# Patient Record
Sex: Male | Born: 1985 | Race: Black or African American | Hispanic: No | Marital: Single | Smoking: Never smoker
Health system: Southern US, Community
[De-identification: ages and names within clinical notes are randomized; demographics above are authoritative.]

## PROBLEM LIST (undated history)

## (undated) DIAGNOSIS — J301 Allergic rhinitis due to pollen: Secondary | ICD-10-CM

---
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
## 2004-10-10 ENCOUNTER — Emergency Department (HOSPITAL_COMMUNITY): Admission: EM | Admit: 2004-10-10 | Discharge: 2004-10-10 | Payer: Self-pay | Admitting: Emergency Medicine

## 2004-10-20 ENCOUNTER — Emergency Department (HOSPITAL_COMMUNITY): Admission: EM | Admit: 2004-10-20 | Discharge: 2004-10-20 | Payer: Self-pay | Admitting: Emergency Medicine

## 2004-11-18 ENCOUNTER — Emergency Department (HOSPITAL_COMMUNITY): Admission: EM | Admit: 2004-11-18 | Discharge: 2004-11-18 | Payer: Self-pay | Admitting: Emergency Medicine

## 2008-10-06 ENCOUNTER — Emergency Department (HOSPITAL_COMMUNITY): Admission: EM | Admit: 2008-10-06 | Discharge: 2008-10-06 | Payer: Self-pay | Admitting: Emergency Medicine

## 2008-10-17 ENCOUNTER — Emergency Department (HOSPITAL_COMMUNITY): Admission: EM | Admit: 2008-10-17 | Discharge: 2008-10-17 | Payer: Self-pay | Admitting: Family Medicine

## 2009-04-18 ENCOUNTER — Emergency Department (HOSPITAL_COMMUNITY): Admission: EM | Admit: 2009-04-18 | Discharge: 2009-04-18 | Payer: Self-pay | Admitting: Emergency Medicine

## 2011-08-20 ENCOUNTER — Emergency Department (HOSPITAL_COMMUNITY)
Admission: EM | Admit: 2011-08-20 | Discharge: 2011-08-20 | Disposition: A | Discharge status: Home / Self Care | Payer: Self-pay | Attending: Emergency Medicine | Admitting: Emergency Medicine

## 2011-08-20 ENCOUNTER — Encounter (HOSPITAL_COMMUNITY): Payer: Self-pay | Admitting: *Deleted

## 2011-08-20 DIAGNOSIS — R109 Unspecified abdominal pain: Secondary | ICD-10-CM | POA: Insufficient documentation

## 2011-08-20 HISTORY — DX: Allergic rhinitis due to pollen: J30.1

## 2011-08-20 LAB — COMPREHENSIVE METABOLIC PANEL
ALT: 53 U/L (ref 0–53)
AST: 32 U/L (ref 0–37)
Albumin: 3.8 g/dL (ref 3.5–5.2)
Calcium: 8.8 mg/dL (ref 8.4–10.5)
Creatinine, Ser: 0.95 mg/dL (ref 0.50–1.35)
GFR calc non Af Amer: >90 mL/min (ref >90)
Sodium: 137 mEq/L (ref 135–145)
Total Protein: 6.9 g/dL (ref 6.0–8.3)

## 2011-08-20 LAB — CBC
HCT: 41.6 % (ref 39.0–52.0)
MCHC: 34.1 g/dL (ref 30.0–36.0)
MCV: 89.7 fL (ref 78.0–100.0)
Platelets: 257 10*3/uL (ref 150–400)
RDW: 13.7 % (ref 11.5–15.5)
WBC: 9 10*3/uL (ref 4.0–10.5)

## 2011-08-20 LAB — URINALYSIS, ROUTINE W REFLEX MICROSCOPIC
Glucose, UA: NEGATIVE mg/dL
Hgb urine dipstick: NEGATIVE
Ketones, ur: NEGATIVE mg/dL
Leukocytes, UA: NEGATIVE
Protein, ur: NEGATIVE mg/dL
Urobilinogen, UA: 0.2 mg/dL (ref 0.0–1.0)

## 2011-08-20 LAB — DIFFERENTIAL
Basophils Absolute: 0 10*3/uL (ref 0.0–0.1)
Basophils Relative: 0 % (ref 0–1)
Eosinophils Relative: 6 % — ABNORMAL HIGH (ref 0–5)
Monocytes Absolute: 0.5 10*3/uL (ref 0.1–1.0)

## 2011-08-20 MED ORDER — FAMOTIDINE 20 MG PO TABS
20.0000 mg | ORAL_TABLET | Freq: Once | ORAL | Status: AC
  Administered 2011-08-20: 20 mg via ORAL
  Filled 2011-08-20: qty 1

## 2011-08-20 MED ORDER — IBUPROFEN 800 MG PO TABS
800.0000 mg | ORAL_TABLET | Freq: Once | ORAL | Status: AC
  Administered 2011-08-20: 800 mg via ORAL
  Filled 2011-08-20: qty 1

## 2011-08-20 NOTE — ED Notes (Signed)
Pt reports abd pain without n/v/d. Pt reports pain feels like pressure, states that pain is worse at night.

## 2011-08-20 NOTE — Progress Notes (Signed)
Pt listed as self pay with no insurance coverage Pt confirms he is self pay [place] resident CM and [facility] coordinator spoke with him Pt offered [facility] services to assist with finding a [place] self pay provider Pt stated he was not interested in information

## 2011-08-20 NOTE — ED Provider Notes (Signed)
History     CSN: [id number]  Arrival date & time 08/20/11  1459   First MD Initiated Contact with Patient 08/20/11 1719      Chief Complaint  Patient presents with  . Abdominal Pain    (Consider location/radiation/quality/duration/timing/severity/associated sxs/prior treatment) HPI  C/o abdominal pain x 3 days, worsening while he's trying to sleep. Describes as pressure. Constant at night (worse) and during the day. No back pain. Denies N/V/d/constipation. Does not feel like his indigestion. Denies hematuria/dysuria//urgency. Has been using the bathroom more at night.  Sexually active. No penile discharge. No scrotal pain. Denies fever/chills. Denies h/o abdominal surgeries.  Denies CP/SOB  ED Notes, ED Provider Notes from 08/20/11 0000 to 08/20/11 16:15:45       Myna Hidalgo, RN 08/20/2011 16:14      Pt reports abd pain without n/v/d. Pt reports pain feels like pressure, states that pain is worse at night.    Past Medical History  Diagnosis Date  . Hay fever     History reviewed. No pertinent past surgical history.  No family history on file.  History  Substance Use Topics  . Smoking status: Never Smoker   . Smokeless tobacco: Not on file  . Alcohol Use: Yes     occasionally      Review of Systems  All other systems reviewed and are negative.  except as noted HPI  Allergies  Review of patient's allergies indicates no known allergies.  Home Medications   Current Outpatient Rx  Name Route Sig Dispense Refill  . LORATADINE 10 MG PO TABS Oral Take 10 mg by mouth daily.    . SODIUM & POTASSIUM BICARBONATE PO TBEF Oral Take 1 tablet by mouth daily as needed. Acid reflux      BP 122/82  Pulse 76  Temp(Src) 98.3 F (36.8 C) (Oral)  Resp 16  SpO2 100%  Physical Exam  Nursing note and vitals reviewed. Constitutional: He is oriented to person, place, and time. He appears well-developed and well-nourished. No distress.  HENT:  Head: Atraumatic.    Mouth/Throat: Oropharynx is clear and moist.  Eyes: Conjunctivae are normal. Pupils are equal, round, and reactive to light.  Neck: Neck supple.  Cardiovascular: Normal rate, regular rhythm, normal heart sounds and intact distal pulses.  Exam reveals no gallop and no friction rub.   No murmur heard. Pulmonary/Chest: Effort normal. No respiratory distress. He has no wheezes. He has no rales.  Abdominal: Soft. Bowel sounds are normal. There is tenderness. There is no rebound and no guarding.       Min epigastric/superior periumbilical ttp No RLQ ttp  Genitourinary:       External genitalia unremarkable No testicle/scrotal edema/erythema/ttp  Musculoskeletal: Normal range of motion. He exhibits no edema and no tenderness.  Neurological: He is alert and oriented to person, place, and time.  Skin: Skin is warm and dry.  Psychiatric: He has a normal mood and affect.    ED Course  Procedures (including critical care time)  Labs Reviewed  DIFFERENTIAL - Abnormal; Notable for the following:    Eosinophils Relative 6 (*)    All other components within normal limits  URINALYSIS, ROUTINE W REFLEX MICROSCOPIC  CBC  COMPREHENSIVE METABOLIC PANEL  LIPASE, BLOOD  LAB REPORT - SCANNED   No results found.   1. Abdominal  pain, other specified site    MDM  Abdominal pain, unk cause x 3 days. Patient appears well. Abd benign. No r/g. Labs unremarkable including U/A.  I do not feel that the patient requires abdominal imaging at this time. Low suspicion for colitis, cholecystitis, appendicitis. Will discharge home with precautions for return.     PMD- none      Forbes Cellar, MD 08/22/11 [phone]

## 2011-08-20 NOTE — ED Notes (Signed)
MD at bedside. Dr. Webb at bedside.  

## 2011-08-20 NOTE — Discharge Instructions (Signed)
Abdominal Pain Abdominal pain can be caused by many things. Your caregiver decides the seriousness of your pain by an examination and possibly blood tests and X-rays. Many cases can be observed and treated at home. Most abdominal pain is not caused by a disease and will probably improve without treatment. However, in many cases, more time must pass before a clear cause of the pain can be found. Before that point, it may not be known if you need more testing, or if hospitalization or surgery is needed. HOME CARE INSTRUCTIONS   Do not take laxatives unless directed by your caregiver.   Take pain medicine only as directed by your caregiver.   Only take over-the-counter or prescription medicines for pain, discomfort, or fever as directed by your caregiver.   Try a clear liquid diet (broth, tea, or water) for as long as directed by your caregiver. Slowly move to a bland diet as tolerated.  SEEK IMMEDIATE MEDICAL CARE IF:   The pain does not go away.   You have a fever.   You keep throwing up (vomiting).   The pain is felt only in portions of the abdomen. Pain in the right side could possibly be appendicitis. In an adult, pain in the left lower portion of the abdomen could be colitis or diverticulitis.   You pass bloody or black tarry stools.  MAKE SURE YOU:   Understand these instructions.   Will watch your condition.   Will get help right away if you are not doing well or get worse.  Document Released: 02/28/2005 Document Revised: 05/10/2011 Document Reviewed: 01/07/2008 ExitCare Patient Information 2012 [employer]  RESOURCE GUIDE  Dental Problems  Patients with Medicaid: [facility]                     [facility] [address]                                           [address] Phone:  [phone]                                                   Phone:  [phone]  If unable to pay or uninsured, contact:  [facility] or [place]  [facility] to become qualified for the adult dental clinic.  Chronic Pain Problems Contact [facility]  [phone] Patients need to be referred by their primary care doctor.  Insufficient Money for Medicine Contact [employer]:  call "211" or [facility] 271-5999.  No Primary Care Doctor Call [facility]  [phone] Other agencies that provide inexpensive medical care    [facility]  [phone]    [facility]  [phone]    [facility]  [phone]    [facility] Clinic  [phone]    [facility]  [phone]    [facility]  [phone]  Psychological Services [facility]  [phone] [facility]  [phone] [place] [facility]   [phone] (emergency services 641-4993)  Abuse/Neglect [place] Child Abuse Hotline (336) 641-3795 [place] Child Abuse Hotline 800-378-5315 (After Hours)  Emergency Shelter [facility] (336) 271-5985  Maternity Homes   [facility] (336) 275-9566 [facility] (704) 372-4663  MRSA Hotline #:   832-7006    [place] [facility]  [facility] [place]  [employer]                           [place] [facility] [address] [place]                     [address]         [address]  [place]                                               [place]                              [place] Phone:  [phone]                                  Phone:  [phone]                   Phone:  [phone]  [place] [facility] Phone:  [phone]  [place] Child Abuse Hotline (336) 342-1394 (336) 342-3537 (After Hours)  

## 2012-08-12 ENCOUNTER — Encounter (HOSPITAL_COMMUNITY): Payer: Self-pay | Admitting: Emergency Medicine

## 2012-08-12 ENCOUNTER — Emergency Department (HOSPITAL_COMMUNITY): Payer: Self-pay

## 2012-08-12 ENCOUNTER — Emergency Department (HOSPITAL_COMMUNITY)
Admission: EM | Admit: 2012-08-12 | Discharge: 2012-08-12 | Disposition: A | Discharge status: Home / Self Care | Payer: Self-pay | Attending: Emergency Medicine | Admitting: Emergency Medicine

## 2012-08-12 DIAGNOSIS — R0602 Shortness of breath: Secondary | ICD-10-CM | POA: Insufficient documentation

## 2012-08-12 DIAGNOSIS — Z79899 Other long term (current) drug therapy: Secondary | ICD-10-CM | POA: Insufficient documentation

## 2012-08-12 DIAGNOSIS — Z8709 Personal history of other diseases of the respiratory system: Secondary | ICD-10-CM | POA: Insufficient documentation

## 2012-08-12 DIAGNOSIS — R0789 Other chest pain: Secondary | ICD-10-CM | POA: Insufficient documentation

## 2012-08-12 LAB — D-DIMER, QUANTITATIVE: D-Dimer, Quant: 0.3 ug/mL-FEU (ref 0.00–0.48)

## 2012-08-12 MED ORDER — NAPROXEN 500 MG PO TABS: 500.0000 mg | ORAL_TABLET | Freq: Two times a day (BID) | ORAL | Status: DC

## 2012-08-12 MED ORDER — TRAMADOL HCL 50 MG PO TABS: 50.0000 mg | ORAL_TABLET | Freq: Four times a day (QID) | ORAL | Status: DC | PRN

## 2012-08-12 MED ORDER — METHOCARBAMOL 500 MG PO TABS: 1000.0000 mg | ORAL_TABLET | Freq: Four times a day (QID) | ORAL | Status: DC

## 2012-08-12 NOTE — ED Notes (Signed)
Patient transported to X-ray 

## 2012-08-12 NOTE — ED Provider Notes (Signed)
Medical screening examination/treatment/procedure(s) were performed by non-physician practitioner and as supervising physician I was immediately available for consultation/collaboration.   Loren Racer, MD 08/12/12 1536

## 2012-08-12 NOTE — ED Notes (Signed)
States that he picked his son up to go into the daycare and felt a pull in his chest. States that he put the child down and the pain was less intense. States that he has pain in the right chest when he lifts his arms, turns his head or takes a deep breath.

## 2012-08-12 NOTE — ED Provider Notes (Signed)
History     CSN: [id number]  Arrival date & time 08/12/12  1116   First MD Initiated Contact with Patient 08/12/12 1132      Chief Complaint  Patient presents with  . Chest Pain    (Consider location/radiation/quality/duration/timing/severity/associated sxs/prior treatment) HPI Comments: Patient with acute onset of right-sided chest pain approximately one hour prior to arrival that was severe and associated with shortness of breath at onset. Patient was carrying his 66-year-old child who weighs approximately 30 pounds. Pain improved gradually once he put the child down. Pain is made worse with movement. It is not made worse with palpation. It is worse when he takes a deep breath in. Patient states that he drives or flies to [place] frequently for work and he took a 13 hour long car ride last week. He denies pain in his legs or swelling in his legs currently. No history of blood clots. Onset acute. Course is improving.   Patient is a 27 y.o. male presenting with chest pain. The history is provided by the patient.  Chest Pain Associated symptoms: shortness of breath   Associated symptoms: no abdominal pain, no cough, no fever, no headache, no nausea and not vomiting     Past Medical History  Diagnosis Date  . Hay fever     History reviewed. No pertinent past surgical history.  No family history on file.  History  Substance Use Topics  . Smoking status: Never Smoker   . Smokeless tobacco: Not on file  . Alcohol Use: Yes     Comment: occasionally      Review of Systems  Constitutional: Negative for fever.  HENT: Negative for sore throat and rhinorrhea.   Eyes: Negative for redness.  Respiratory: Positive for shortness of breath. Negative for cough.   Cardiovascular: Positive for chest pain. Negative for leg swelling.  Gastrointestinal: Negative for nausea, vomiting, abdominal pain and diarrhea.  Genitourinary: Negative for dysuria.  Musculoskeletal: Negative for  myalgias.  Skin: Negative for rash.  Neurological: Negative for headaches.    Allergies  Review of patient's allergies indicates no known allergies.  Home Medications   Current Outpatient Rx  Name  Route  Sig  Dispense  Refill  . loratadine (CLARITIN) 10 MG tablet   Oral   Take 10 mg by mouth daily.         . sodium-potassium bicarbonate (ALKA-SELTZER GOLD) TBEF   Oral   Take 1 tablet by mouth daily as needed. Acid reflux           BP 137/84  Pulse 106  Temp(Src) 98.5 F (36.9 C) (Oral)  Resp 26  SpO2 100%  Physical Exam  Nursing note and vitals reviewed. Constitutional: He appears well-developed and well-nourished.  HENT:  Head: Normocephalic and atraumatic.  Eyes: Conjunctivae are normal. Right eye exhibits no discharge. Left eye exhibits no discharge.  Neck: Normal range of motion. Neck supple.  Cardiovascular: Regular rhythm and normal heart sounds.  Tachycardia present.   Heart rate 103  Pulmonary/Chest: Effort normal and breath sounds normal. He has no wheezes. He has no rales. He exhibits no tenderness (Pain not readily reproducible to palpation).  Abdominal: Soft. There is no tenderness.  Musculoskeletal: He exhibits no edema and no tenderness.  Patient complains of right chest pain that is worsened with turning head to the right and raising his right arm. No lower extremity edema or tenderness.  Neurological: He is alert.  Skin: Skin is warm and dry.  Psychiatric: He has  a normal mood and affect.    ED Course  Procedures (including critical care time)  Labs Reviewed  D-DIMER, QUANTITATIVE   Dg Chest 2 View  08/12/2012  *RADIOLOGY REPORT*  Clinical Data: Chest pain  CHEST - 2 VIEW  Comparison: None.  Findings: Lungs clear.  Heart size and pulmonary vascularity are normal.  No adenopathy.  No pneumothorax.  No bone lesions.  IMPRESSION: No abnormality noted.   Original Report Authenticated By: Bretta Bang, M.D.      1. Musculoskeletal chest  pain     11:52 AM Patient seen and examined. Work-up initiated. Given risk factors for DVT, will need to rule out PE.   Vital signs reviewed and are as follows: Filed Vitals:   08/12/12 1128  BP: 137/84  Pulse: 106  Temp: 98.5 F (36.9 C)  Resp: 26    Date: 08/12/2012  Rate: 104  Rhythm: sinus tachycardia  QRS Axis: normal  Intervals: normal  ST/T Wave abnormalities: nonspecific T wave changes  Conduction Disutrbances:none  Narrative Interpretation:   Old EKG Reviewed: none available  12:55 PM Patient informed of all results.   Patient was counseled to return with severe chest pain, especially if the pain is crushing or pressure-like and spreads to the arms, back, neck, or jaw, or if they have sweating, nausea, or shortness of breath with the pain. They were encouraged to call 911 with these symptoms.   They were also told to return if their chest pain gets worse and does not go away with rest, they have an attack of chest pain lasting longer than usual despite rest and treatment with the medications their caregiver has prescribed, if they wake from sleep with chest pain or shortness of breath, if they feel dizzy or faint, if they have chest pain not typical of their usual pain, or if they have any other emergent concerns regarding their health.  The patient verbalized understanding and agreed.      MDM  Patient with chest pain worsened with movement and deep breathing. Given DVT risk factors, d-dimer ordered and was normal. CXR neg. EKG abnormal but symptoms are clinically musculoskeletal and reproducible with movement. Do not clinically suspect ACS. Recc PCP f/u and conservative management. Referrals given.         Renne Crigler, PA-C 08/12/12 1259

## 2014-08-30 DIAGNOSIS — R509 Fever, unspecified: Secondary | ICD-10-CM | POA: Insufficient documentation

## 2014-08-30 DIAGNOSIS — R52 Pain, unspecified: Secondary | ICD-10-CM | POA: Insufficient documentation

## 2014-08-30 DIAGNOSIS — M791 Myalgia: Secondary | ICD-10-CM | POA: Insufficient documentation

## 2014-08-30 DIAGNOSIS — Z79899 Other long term (current) drug therapy: Secondary | ICD-10-CM | POA: Insufficient documentation

## 2014-08-30 DIAGNOSIS — Z8709 Personal history of other diseases of the respiratory system: Secondary | ICD-10-CM | POA: Insufficient documentation

## 2014-08-31 ENCOUNTER — Encounter (HOSPITAL_COMMUNITY): Payer: Self-pay | Admitting: Emergency Medicine

## 2014-08-31 ENCOUNTER — Emergency Department (HOSPITAL_COMMUNITY)
Admission: EM | Admit: 2014-08-31 | Discharge: 2014-08-31 | Disposition: A | Discharge status: Home / Self Care | Payer: Self-pay | Attending: Emergency Medicine | Admitting: Emergency Medicine

## 2014-08-31 DIAGNOSIS — R6889 Other general symptoms and signs: Secondary | ICD-10-CM

## 2014-08-31 LAB — RAPID STREP SCREEN (MED CTR MEBANE ONLY): STREPTOCOCCUS, GROUP A SCREEN (DIRECT): NEGATIVE

## 2014-08-31 MED ORDER — ACETAMINOPHEN 325 MG PO TABS
650.0000 mg | ORAL_TABLET | Freq: Once | ORAL | Status: AC
  Administered 2014-08-31: 650 mg via ORAL
  Filled 2014-08-31: qty 2

## 2014-08-31 NOTE — ED Notes (Signed)
Mask placed on patient on return to lobby.

## 2014-08-31 NOTE — ED Notes (Signed)
Patient states at @0400  he began feeling weak, having generalized body aches, and fever. Patient did not check his temperature at home.  Patient denies similar sick contact. Patient taking Theraflu and Nyquil at home.

## 2014-08-31 NOTE — ED Provider Notes (Signed)
CSN: [id number]     Arrival date & time 08/30/14  2357 History   First MD Initiated Contact with Patient 08/31/14 0530     Chief Complaint  Patient presents with  . flu like symptoms     body aches, weak, fever     (Consider location/radiation/quality/duration/timing/severity/associated sxs/prior Treatment) Patient is a 29 y.o. male presenting with URI. The history is provided by the patient. No language interpreter was used.  URI Presenting symptoms: fever   Presenting symptoms: no congestion and no cough   Associated symptoms: myalgias   Associated symptoms comment:  Fever and body aches since yesterday. He reports cold sweats. He has been taking Theraflu for symptoms without relief. No vomiting, significant congestion or cough.    Past Medical History  Diagnosis Date  . Hay fever    History reviewed. No pertinent past surgical history. History reviewed. No pertinent family history. History  Substance Use Topics  . Smoking status: Never Smoker   . Smokeless tobacco: Not on file  . Alcohol Use: 4.2 oz/week    7 Cans of beer per week    Review of Systems  Constitutional: Positive for fever. Negative for chills.  HENT: Negative.  Negative for congestion.   Respiratory: Negative.  Negative for cough.   Cardiovascular: Negative.   Gastrointestinal: Negative.   Musculoskeletal: Positive for myalgias.  Skin: Negative.  Negative for rash.  Neurological: Negative.       Allergies  Review of patient's allergies indicates no known allergies.  Home Medications   Prior to Admission medications   Medication Sig Start Date End Date Taking? Authorizing Provider  Chlorphen-Pseudoephed-APAP (THERAFLU FLU/COLD PO) Take 1 packet by mouth 2 (two) times daily.   Yes Historical Provider, MD  DM-Doxylamine-Acetaminophen (NYQUIL COLD & FLU PO) Take 15 mLs by mouth 2 (two) times daily.   Yes Historical Provider, MD  aspirin 325 MG tablet Take 650 mg by mouth once.    Historical  Provider, MD  methocarbamol (ROBAXIN) 500 MG tablet Take 2 tablets (1,000 mg total) by mouth 4 (four) times daily. Patient not taking: Reported on 08/31/2014 08/12/12   Renne Crigler, PA-C  naproxen (NAPROSYN) 500 MG tablet Take 1 tablet (500 mg total) by mouth 2 (two) times daily. Patient not taking: Reported on 08/31/2014 08/12/12   Renne Crigler, PA-C  traMADol (ULTRAM) 50 MG tablet Take 1 tablet (50 mg total) by mouth every 6 (six) hours as needed for pain. Patient not taking: Reported on 08/31/2014 08/12/12   Renne Crigler, PA-C   BP 111/69 mmHg  Pulse 101  Temp(Src) 100 F (37.8 C) (Oral)  Resp 18  SpO2 97% Physical Exam  Constitutional: He is oriented to person, place, and time. He appears well-developed and well-nourished. No distress.  HENT:  Head: Normocephalic.  Right Ear: Tympanic membrane normal.  Left Ear: Tympanic membrane normal.  Mouth/Throat: Oropharynx is clear and moist. No oropharyngeal exudate.  Eyes: Conjunctivae are normal.  Neck: Normal range of motion. Neck supple.  Cardiovascular: Normal rate and regular rhythm.   Pulmonary/Chest: Effort normal. He has no wheezes. He has no rales.  Abdominal: Soft. Bowel sounds are normal. There is no tenderness. There is no rebound and no guarding.  Musculoskeletal: Normal range of motion.  Lymphadenopathy:    He has no cervical adenopathy.  Neurological: He is alert and oriented to person, place, and time.  Skin: Skin is warm and dry. No rash noted.  Psychiatric: He has a normal mood and affect.  ED Course  Procedures (including critical care time) Labs Review Labs Reviewed  RAPID STREP SCREEN  CULTURE, GROUP A STREP    Imaging Review No results found.   EKG Interpretation None      MDM   Final diagnoses:  None    1. Flu like illness  Patient is non-toxic in appearance, otherwise healthy. He is drinking in the ED without difficulty. He is felt stable for discharge home.     Elpidio AnisShari Kathy Wahid,  PA-C 08/31/14 [id number]  Paula LibraJohn Molpus, MD 08/31/14 [id number]

## 2014-08-31 NOTE — Discharge Instructions (Signed)

## 2014-09-02 LAB — CULTURE, GROUP A STREP: STREP A CULTURE: NEGATIVE

## 2014-09-19 IMAGING — CR DG CHEST 2V
2 series · 2 of 2 positions shown · non-contrast
Comparison: None.

CLINICAL DATA: Chest pain

CHEST - 2 VIEW

[w chest pa]
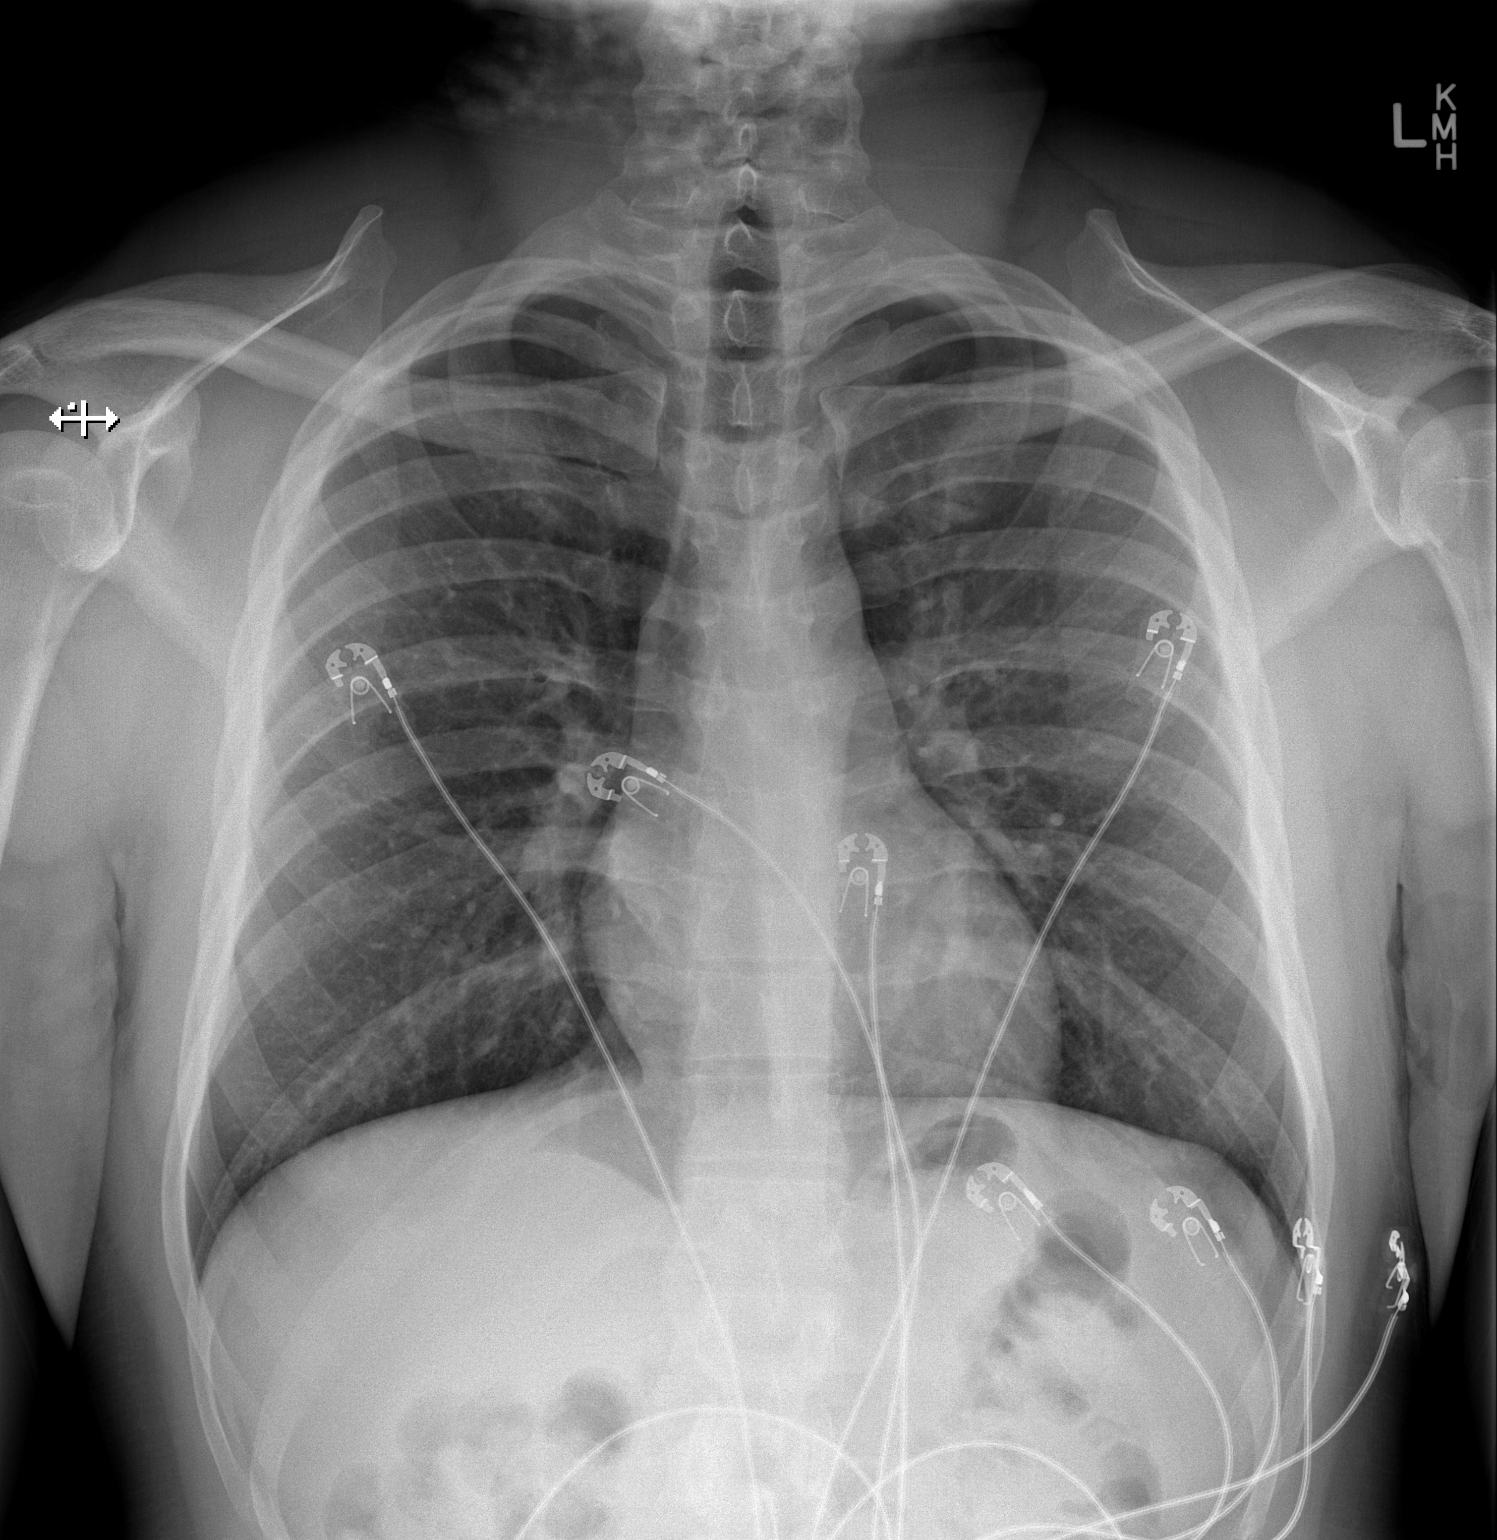

[w chest lat]
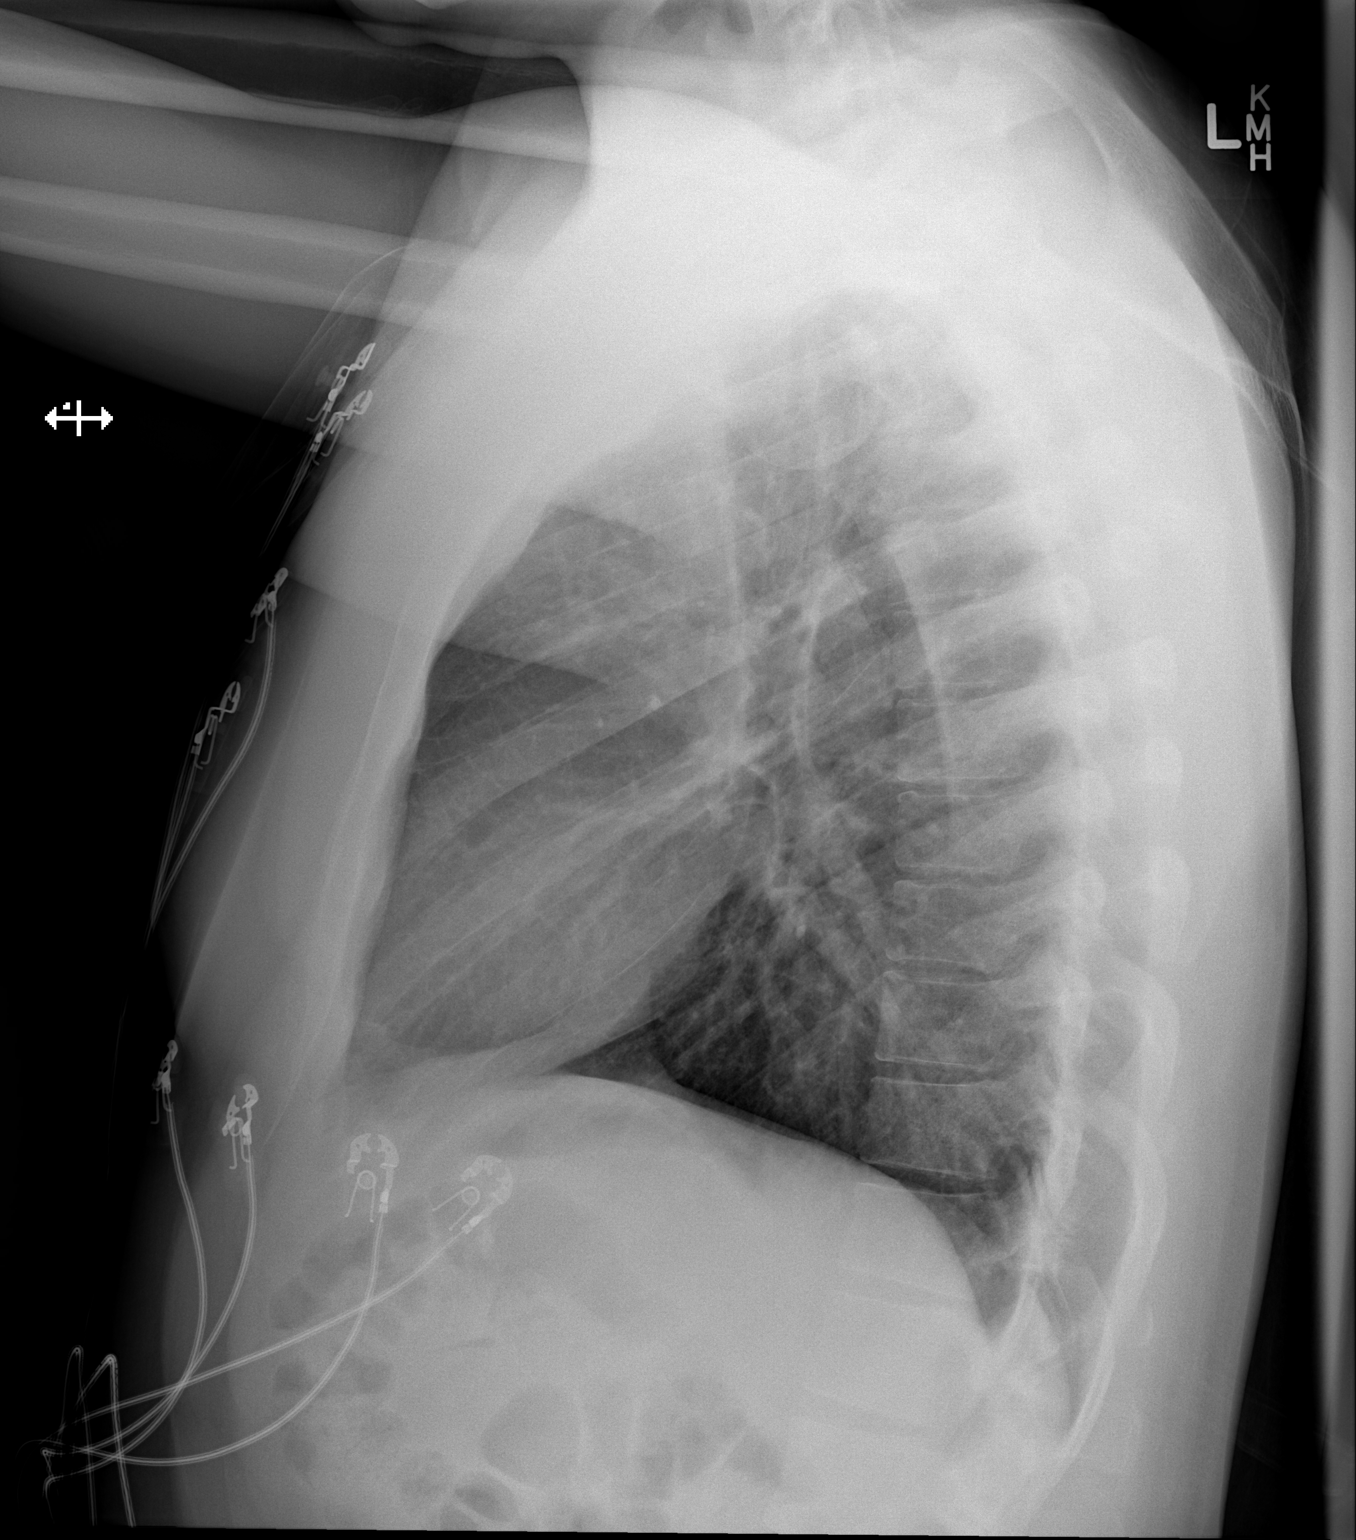

[2 of 2 positions shown; findings below may reference images not displayed]

FINDINGS: Lungs clear.  Heart size and pulmonary vascularity are
normal.  No adenopathy.  No pneumothorax.  No bone lesions.
IMPRESSION: No abnormality noted.

## 2016-10-29 ENCOUNTER — Emergency Department (HOSPITAL_COMMUNITY)
Admission: EM | Admit: 2016-10-29 | Discharge: 2016-10-29 | Disposition: A | Discharge status: Home / Self Care | Payer: Self-pay | Attending: Emergency Medicine | Admitting: Emergency Medicine

## 2016-10-29 ENCOUNTER — Emergency Department (HOSPITAL_COMMUNITY): Payer: Self-pay

## 2016-10-29 ENCOUNTER — Encounter (HOSPITAL_COMMUNITY): Payer: Self-pay | Admitting: Emergency Medicine

## 2016-10-29 DIAGNOSIS — Z79899 Other long term (current) drug therapy: Secondary | ICD-10-CM | POA: Insufficient documentation

## 2016-10-29 DIAGNOSIS — R0789 Other chest pain: Secondary | ICD-10-CM | POA: Insufficient documentation

## 2016-10-29 DIAGNOSIS — F419 Anxiety disorder, unspecified: Secondary | ICD-10-CM | POA: Insufficient documentation

## 2016-10-29 LAB — RAPID URINE DRUG SCREEN, HOSP PERFORMED
Amphetamines: NOT DETECTED
BARBITURATES: NOT DETECTED
Benzodiazepines: NOT DETECTED
Cocaine: NOT DETECTED
Opiates: NOT DETECTED
Tetrahydrocannabinol: POSITIVE — AB

## 2016-10-29 LAB — CBC WITH DIFFERENTIAL/PLATELET
BASOS ABS: 0 10*3/uL (ref 0.0–0.1)
BASOS PCT: 0 %
EOS ABS: 0.4 10*3/uL (ref 0.0–0.7)
EOS PCT: 4 %
HCT: 41.5 % (ref 39.0–52.0)
Hemoglobin: 14.4 g/dL (ref 13.0–17.0)
Lymphocytes Relative: 24 %
Lymphs Abs: 2.2 10*3/uL (ref 0.7–4.0)
MCH: 31 pg (ref 26.0–34.0)
MCHC: 34.7 g/dL (ref 30.0–36.0)
MCV: 89.4 fL (ref 78.0–100.0)
MONO ABS: 0.9 10*3/uL (ref 0.1–1.0)
Monocytes Relative: 10 %
Neutro Abs: 5.8 10*3/uL (ref 1.7–7.7)
Neutrophils Relative %: 62 %
PLATELETS: 313 10*3/uL (ref 150–400)
RBC: 4.64 MIL/uL (ref 4.22–5.81)
RDW: 13.7 % (ref 11.5–15.5)
WBC: 9.3 10*3/uL (ref 4.0–10.5)

## 2016-10-29 LAB — BASIC METABOLIC PANEL
Anion gap: 14 (ref 5–15)
BUN: 16 mg/dL (ref 6–20)
CALCIUM: 9.3 mg/dL (ref 8.9–10.3)
CO2: 22 mmol/L (ref 22–32)
CREATININE: 1.03 mg/dL (ref 0.61–1.24)
Chloride: 104 mmol/L (ref 101–111)
GFR calc Af Amer: >60 mL/min (ref >60)
Glucose, Bld: 93 mg/dL (ref 65–99)
Potassium: 3.4 mmol/L — ABNORMAL LOW (ref 3.5–5.1)
SODIUM: 140 mmol/L (ref 135–145)

## 2016-10-29 LAB — I-STAT TROPONIN, ED: TROPONIN I, POC: 0 ng/mL (ref 0.00–0.08)

## 2016-10-29 LAB — TROPONIN I

## 2016-10-29 LAB — D-DIMER, QUANTITATIVE: D-Dimer, Quant: <0.27 ug/mL-FEU (ref 0.00–0.50)

## 2016-10-29 MED ORDER — LORAZEPAM 2 MG/ML IJ SOLN
0.5000 mg | Freq: Once | INTRAMUSCULAR | Status: AC
  Administered 2016-10-29: 0.5 mg via INTRAVENOUS
  Filled 2016-10-29: qty 1

## 2016-10-29 MED ORDER — SODIUM CHLORIDE 0.9 % IV BOLUS (SEPSIS)
1000.0000 mL | Freq: Once | INTRAVENOUS | Status: AC
  Administered 2016-10-29: 1000 mL via INTRAVENOUS

## 2016-10-29 NOTE — Discharge Instructions (Signed)
It was my pleasure taking care of you today! Fortunately, your workup today was very reassuring with no signs of an acute emergent medical condition. I would like you to follow-up with your primary care provider. If you do not have one, please see below. Return to the emergency department for new or worsening symptoms, any additional concerns.   To find a primary care or specialty doctor please call [phone] or [phone] to access " Find a Doctor Service."  You may also go on the [facility] website at [email or web address]  There are also multiple [facility] practices throughout the [place] that are frequently accepting new patients. You may find a clinic that is close to your home and contact them.  [facility] - [address] [place] [phone]  [facility] [place] (also locations in [place] and [place]) - [address] [place] [phone]  [facility] - [address] [place] [phone]

## 2016-10-29 NOTE — ED Notes (Signed)
REPEAT GIVEN TO EDP ISSACS

## 2016-10-29 NOTE — ED Provider Notes (Signed)
[facility]-EMERGENCY DEPT Provider Note   CSN: [id number] Arrival date & time: 10/29/16  4098     History   Chief Complaint Chief Complaint  Patient presents with  . Anxiety    HPI Jesse Brooks is a 31 y.o. male.  The history is provided by the patient and medical records. No language interpreter was used.  Anxiety  Associated symptoms include chest pain and shortness of breath.   Jesse Brooks is a 31 y.o. male  who presents to the Emergency Department complaining of anxiety attack which began this morning. Patient states that he has been very stressed about family and finances for the last several months. 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 He states his mother is suicidal and he often has to care for her "physically, emotionally, financially, everything". He denies any SI or HI of his own. He states that he went to work this morning and his work environment was very warm. He began to "stress out" and central chest began to get tight. He also endorses muscle cramps to bilateral legs and hands. No abdominal pain, nausea or vomiting. Took a naproxen with no improvement. Denies any other medications or illicit drug use. He states that he had a very similar incident when stressed out 6 months ago that self-resolved with no intervention and he did not seek care. He tried to "calm down" like he did last time, but still felt like he couldn't breath, prompting him to come to ER.   Past Medical History:  Diagnosis Date  . Hay fever     There are no active problems to display for this patient.   History reviewed. No pertinent surgical history.     Home Medications    Prior to Admission medications   Medication Sig Start Date End Date Taking? Authorizing Provider  methocarbamol (ROBAXIN) 500 MG tablet Take 2 tablets (1,000 mg total) by mouth 4 (four) times daily. Patient not taking: Reported on 08/31/2014 08/12/12   Renne Crigler, PA-C  naproxen (NAPROSYN) 500 MG tablet Take 1  tablet (500 mg total) by mouth 2 (two) times daily. Patient not taking: Reported on 08/31/2014 08/12/12   Renne Crigler, PA-C  traMADol (ULTRAM) 50 MG tablet Take 1 tablet (50 mg total) by mouth every 6 (six) hours as needed for pain. Patient not taking: Reported on 08/31/2014 08/12/12   Renne Crigler, PA-C    Family History No family history on file.  Social History Social History  Substance Use Topics  . Smoking status: Never Smoker  . Smokeless tobacco: Not on file  . Alcohol use 4.2 oz/week    7 Cans of beer per week     Allergies   Patient has no known allergies.   Review of Systems Review of Systems  Respiratory: Positive for shortness of breath. Negative for cough and wheezing.   Cardiovascular: Positive for chest pain.  Musculoskeletal: Positive for myalgias.  Psychiatric/Behavioral: Negative for confusion, hallucinations and suicidal ideas. The patient is nervous/anxious.   All other systems reviewed and are negative.    Physical Exam Updated Vital Signs BP (!) 118/93 (BP Location: Left Arm)   Pulse 68   Temp 98.3 F (36.8 C) (Oral)   Resp 16   Ht 5\' 11"  (1.803 m)   Wt 72.6 kg (160 lb)   SpO2 100%   BMI 22.32 kg/m   Physical Exam  Constitutional: He is oriented to person, place, and time. He appears well-developed and well-nourished. No distress.  HENT:  Head:  Normocephalic and atraumatic.  Cardiovascular: Normal rate, regular rhythm and normal heart sounds.   No murmur heard. Pulmonary/Chest: Effort normal and breath sounds normal. No respiratory distress. He has no wheezes. He has no rales. He exhibits no tenderness.  Abdominal: Soft. He exhibits no distension. There is no tenderness.  Musculoskeletal: He exhibits no edema.  No tenderness to palpation of upper or lower extremities. Full range of motion and 5/5 strength. No contractures or visible muscle spasms. No lower extremity edema or calf tenderness.  Neurological: He is alert and oriented to  person, place, and time.  Skin: Skin is warm and dry.  Nursing note and vitals reviewed.    ED Treatments / Results  Labs (all labs ordered are listed, but only abnormal results are displayed) Labs Reviewed  BASIC METABOLIC PANEL - Abnormal; Notable for the following:       Result Value   Potassium 3.4 (*)    All other components within normal limits  RAPID URINE DRUG SCREEN, HOSP PERFORMED - Abnormal; Notable for the following:    Tetrahydrocannabinol POSITIVE (*)    All other components within normal limits  CBC WITH DIFFERENTIAL/PLATELET  D-DIMER, QUANTITATIVE (NOT AT [facility])  TROPONIN I  I-STAT TROPOININ, ED  I-STAT TROPOININ, ED    EKG  EKG Interpretation  Date/Time:  Monday Oct 29 2016 08:05:07 EDT Ventricular Rate:  76 PR Interval:    QRS Duration: 84 QT Interval:  395 QTC Calculation: 445 R Axis:   64 Text Interpretation:  Sinus rhythm Nonspecific T abnormalities, anterior leads Since last EKG, persistent anterior TWI but no acute changes Confirmed by Shaune PollackIsaacs, Cameron [phone]([id number]) on 10/29/2016 9:15:03 AM       Radiology Dg Chest 2 View  Result Date: 10/29/2016 CLINICAL DATA:  Shortness of Breath EXAM: CHEST  2 VIEW COMPARISON:  08/12/2012 FINDINGS: Cardiac shadows within normal limits. Lungs are well aerated bilaterally. No focal infiltrate or sizable effusion is seen. No bony abnormality is noted. IMPRESSION: No active cardiopulmonary disease. Electronically Signed   By: Alcide CleverMark  Lukens M.D.   On: 10/29/2016 07:41    Procedures Procedures (including critical care time)  Medications Ordered in ED Medications  sodium chloride 0.9 % bolus 1,000 mL (0 mLs Intravenous Stopped 10/29/16 1131)  LORazepam (ATIVAN) injection 0.5 mg (0.5 mg Intravenous Given 10/29/16 0757)     Initial Impression / Assessment and Plan / ED Course  I have reviewed the triage vital signs and the nursing notes.  Pertinent labs & imaging results that were available during my care of the patient  were reviewed by me and considered in my medical decision making (see chart for details).    Jesse Brooks is a 31 y.o. male who presents to ED For concerns of anxiety attack. Complaining of feeling overwhelmed, and chest tightness, bilateral upper and lower extremity cramping and breathing acutely about a.m. today while at work. Upon initial evaluation, patient is afebrile with no tachycardia or hypoxia. 100% O2 on room air. Normal cardiopulmonary examination. No ER evaluations for similar in the past. EKG initially with anterior T-wave inversion. Troponin negative. D-dimer negative. Her labs reviewed and reassuring. He has positive for marijuana, but no other illicit drugs. Patient given liter of fluids, Valium and monitored in ED for 3 hours. Repeat EKG with no acute events and unchanged from previous. 2nd troponin negative. Evaluation does not show pathology that would require ongoing emergent intervention or inpatient treatment. On reevaluation, patient feels better. He is no longer having chest pain or  trouble breathing. Muscle cramps resolved. Family at bedside and does feel comfortable with him going home. No SI/HI. He does not have a primary care provider. I provided him resources for PCPs. We spoke at length about reasons to return to the ER. Her questions answered.   Final Clinical Impressions(s) / ED Diagnoses   Final diagnoses:  Chest tightness  Anxiety    New Prescriptions New Prescriptions   No medications on file     Melanny Wire, Chase Picket, PA-C 10/29/16 1202    Shaune Pollack, MD 10/30/16 [phone]

## 2016-10-29 NOTE — ED Notes (Signed)
Marijean NiemannJaime PA questioned unresulted for I stat troponin drawn at [phone]; order placed to transfer order to main lab troponin at present time. Main lab made aware and verbalizes troponin in process at present time. Primary RN Morrie SheldonAshley notified of the above.

## 2016-10-29 NOTE — ED Notes (Signed)
AWARE OF NEED FOR URINE. URINAL AT BEDSIDE 

## 2016-10-29 NOTE — ED Triage Notes (Addendum)
Pt comes to ed, via ems, pt has c/o anxiety/  Cramping in arms and legs/ hyper vent, and chest thigh ness.Onset tonight a 5am.  Family and finances are stressing him out. V/s 136/84, pulse 108, [id number]  Walking, alert and oriented.

## 2016-10-29 NOTE — ED Notes (Signed)
Patient transported to X-ray 

## 2019-12-14 ENCOUNTER — Ambulatory Visit (HOSPITAL_COMMUNITY): Payer: Self-pay | Admitting: Psychiatry

## 2019-12-21 ENCOUNTER — Ambulatory Visit (INDEPENDENT_AMBULATORY_CARE_PROVIDER_SITE_OTHER): Payer: No Payment, Other | Admitting: Clinical

## 2019-12-21 ENCOUNTER — Other Ambulatory Visit: Payer: Self-pay

## 2019-12-21 DIAGNOSIS — F411 Generalized anxiety disorder: Secondary | ICD-10-CM | POA: Diagnosis not present

## 2019-12-21 DIAGNOSIS — F410 Panic disorder [episodic paroxysmal anxiety] without agoraphobia: Secondary | ICD-10-CM

## 2019-12-21 NOTE — Progress Notes (Signed)
Comprehensive Clinical Assessment (CCA) Note  12/21/2019 Jesse Brooks [id number]  Visit Diagnosis:      ICD-10-CM   1. Generalized anxiety disorder with panic attacks  F41.1    F41.0        CCA Biopsychosocial  Intake/Chief Complaint:  CCA Intake With Chief Complaint CCA Part Two Date: 12/21/19 CCA Part Two Time: 1100 Chief Complaint/Presenting Problem: Client stated, "my anxiety has gotten worse". Patient's Currently Reported Symptoms/Problems: Cient stated, "feeling overwhelmed, worry, chest spasms, trouble falling and staying asleep". Individual's Preferences: Client stated, "to figure out my thoughts and stop thinking everything is going to crash or get worse, I want to have better days and better thoughts". Type of Services Patient Feels Are Needed: Therapy  Mental Health Symptoms Depression:  Depression: None  Mania:  Mania: N/A  Anxiety:   Anxiety: Difficulty concentrating, Restlessness, Sleep, Tension, Worrying  Psychosis:  Psychosis: None  Trauma:  Trauma: N/A  Obsessions:  Obsessions: N/A  Compulsions:  Compulsions: N/A  Inattention:  Inattention: N/A  Hyperactivity/Impulsivity:  Hyperactivity/Impulsivity: N/A  Oppositional/Defiant Behaviors:  Oppositional/Defiant Behaviors: N/A  Emotional Irregularity:  Emotional Irregularity: N/A  Other Mood/Personality Symptoms:      Mental Status Exam Appearance and self-care  Stature:  Stature: Average  Weight:  Weight: Average weight  Clothing:  Clothing: Casual, Neat/clean  Grooming:  Grooming: Normal  Cosmetic use:  Cosmetic Use: Age appropriate  Posture/gait:  Posture/Gait: Normal  Motor activity:  Motor Activity: Not Remarkable  Sensorium  Attention:  Attention: Normal  Concentration:  Concentration: Normal  Orientation:  Orientation: X5  Recall/memory:  Recall/Memory: Normal  Affect and Mood  Affect:  Affect: Congruent  Mood:  Mood: Euthymic  Relating  Eye contact:  Eye Contact: Normal  Facial expression:   Facial Expression: Responsive  Attitude toward examiner:  Attitude Toward Examiner: Cooperative  Thought and Language  Speech flow: Speech Flow: Clear and Coherent  Thought content:  Thought Content: Appropriate to Mood and Circumstances  Preoccupation:  Preoccupations: None  Hallucinations:  Hallucinations: None  Organization:     Company secretary of Knowledge:  Fund of Knowledge: Good  Intelligence:  Intelligence: Average  Abstraction:  Abstraction: Functional  Judgement:  Judgement: Good  Reality Testing:  Reality Testing: Adequate  Insight:  Insight: Good  Decision Making:  Decision Making: Normal  Social Functioning  Social Maturity:  Social Maturity: Responsible  Social Judgement:  Social Judgement: Normal  Stress  Stressors:  Stressors: Family conflict  Coping Ability:  Coping Ability: Normal  Skill Deficits:  Skill Deficits: Self-care  Supports:  Supports: Friends/Service system, Family     Religion: Religion/Spirituality Are You A Religious Person?: No  Leisure/Recreation: Leisure / Recreation Do You Have Hobbies?: Yes  Exercise/Diet: Exercise/Diet Do You Exercise?: No Have You Gained or Lost A Significant Amount of Weight in the Past Six Months?: No Do You Follow a Special Diet?: No Do You Have Any Trouble Sleeping?: Yes   CCA Employment/Education  Employment/Work Situation: Employment / Work Situation Employment situation: Employed Where is patient currently employed?: 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  Education: Education Did Garment/textile technologist From [employer]?: Yes   CCA Family/Childhood History  Family and Relationship History: Family history Marital status: Single (Client reported he seperated from his sons mother in [place] 2021.) Does patient have children?: Yes How many children?: 2 How is patient's relationship with their children?: Client reported he has a 68 year old son that he sees often. Client reported the mother of his  youngest son does not allow visitation as often which causes him stress.  Childhood History:  Childhood History By whom was/is the patient raised?: Grandparents Additional childhood history information: Client reported he was raised by his paternal grandparents. Client reported his father was away going to shool and working so he saw him during breaks from school and on holidays. Client reported his mother partied and visited the client on one or two weekends out of the month. Description of patient's relationship with caregiver when they were a child: Client reported his grandmother was a positive influence with religious values. Client reported he looked after his grandmother due to her worsening symptoms of MS. Client reported his grandfather was an alcoholic and he symptoms with PTSD. Patient's description of current relationship with people who raised him/her: Client reported an improved relationship with his father but he is still not a part of his daily life. Client reported he was the care taker for his mother from 2016- 2018 for her chronic health conditions.  Child/Adolescent Assessment:     CCA Substance Use  Alcohol/Drug Use: Alcohol / Drug Use History of alcohol / drug use?: No history of alcohol / drug abuse                         ASAM's:  Six Dimensions of Multidimensional Assessment  Dimension 1:  Acute Intoxication and/or Withdrawal Potential:      Dimension 2:  Biomedical Conditions and Complications:      Dimension 3:  Emotional, Behavioral, or Cognitive Conditions and Complications:     Dimension 4:  Readiness to Change:     Dimension 5:  Relapse, Continued use, or Continued Problem Potential:     Dimension 6:  Recovery/Living Environment:     ASAM Severity Score:    ASAM Recommended Level of Treatment:     Substance use Disorder (SUD)    Recommendations for Services/Supports/Treatments: Recommendations for  Services/Supports/Treatments Recommendations For Services/Supports/Treatments: Individual Therapy, Medication Management  DSM5 Diagnoses: Patient Active Problem List   Diagnosis Date Noted  . Generalized anxiety disorder with panic attacks 12/21/2019    Patient Centered Plan: Patient is on the following Treatment Plan(s):  Anxiety   Interpretive Summary:  Client is a 34 year old male. Client is presenting via referral from a friend for behavioral health services. Client states mental health symptoms as evidenced by panic attacks, stress, and feeling overwhelmed. Client denies suicidal and homicidal ideations at this time.  Client denies hallucinations and delusions at this time. Client reported no substance use.  Client was screened for the following SDOH:  GAD 7 : Generalized Anxiety Score 12/21/2019  Nervous, Anxious, on Edge 2  Control/stop worrying 2  Worry too much - different things 2  Trouble relaxing 2  Restless 2  Easily annoyed or irritable 2  Afraid - awful might happen 2  Total GAD 7 Score 14  Anxiety Difficulty Very difficult     Client meets criteria for GENERALIZED ANXIETY DISORDER WITH PANIC ATTACKS evidenced by the clients report of excessive anxiety and worry occurring more days than not, difficulty controlling the worry, feeling on edge, difficulty concentrating, sleep disturbance, accelerated heart rate, chest discomfort, tingling sensations, and shortness of breath.  Client reported his symptoms peaked during 2020. Client reported an accumulation of family and financial stressors have impacted his symptoms over the past year. Client reported three ER visits in the past month because of his anxiety and panic attack symptoms.   Treatment recommendations  are individual therapy. Client declined psychiatric evaluation and medication management at this time.  Clinician provided information on format of appointment (virtual or face to face).  Client was in  agreement with treatment recommendations.    Referrals to Alternative Service(s): Referred to Alternative Service(s):   Place:   Date:   Time:    Referred to Alternative Service(s):   Place:   Date:   Time:    Referred to Alternative Service(s):   Place:   Date:   Time:    Referred to Alternative Service(s):   Place:   Date:   Time:     Loree Fee

## 2020-01-07 ENCOUNTER — Other Ambulatory Visit: Payer: Self-pay

## 2020-01-07 ENCOUNTER — Ambulatory Visit (INDEPENDENT_AMBULATORY_CARE_PROVIDER_SITE_OTHER): Payer: No Payment, Other | Admitting: Clinical

## 2020-01-07 DIAGNOSIS — F411 Generalized anxiety disorder: Secondary | ICD-10-CM | POA: Diagnosis not present

## 2020-01-07 DIAGNOSIS — F410 Panic disorder [episodic paroxysmal anxiety] without agoraphobia: Secondary | ICD-10-CM

## 2020-01-08 NOTE — Progress Notes (Signed)
   THERAPIST PROGRESS NOTE  Session Time: 53 minutes  Participation Level: Active  Behavioral Response: CasualAlertEuthymic  Type of Therapy: Individual Therapy  Treatment Goals addressed: Anxiety  Interventions: CBT  Summary:  Jesse Brooks is a 34 y.o. male who presents in person for the scheduled session. Client presented oriented times five, appropriately, and friendly. Client denied hallucinations and delusions. Client reported having a good day today. Client reported over the past week his anxiety has been manageable with a few minor episodes of feeling tingling in his hands and fingers. 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  Client reported struggling with triggers of co -parenting with his sons mother has been a major trigger for is anxiety. Client reported he worries about missing out on milestones and starting over building his relationship with him because he is not able to see him as often as he'd like due to the mothers decision. Client reported during times of anxiety he has to make himself breathe. Client reported his mind starts to go on a cycle about everything he needs to get done or might be dreading. Client discussed with the therapist effective habits that could be used to increase his coping skills. Client discussed that he has a routine of exercising and listening to music. Client engaged in conversation with the therapist to utilize his time of exercise to focus on his breathing exercises and thought processing for the day.     Suicidal/Homicidal: Nowithout intent/plan  Therapist Response: Therapist began the session by checking in and asking how he has been feeling since the last session. Therapist allowed time to focus on the clients thoughts and feelings. Therapist continued working toward building a therapeutic relationship by using active listening and positive  feedback. Therapist prompted the client to identify his triggers and his bodily reactions. Therapist discussed using breathing and progressive muscle relaxation techniques while using existing activities that are aprt of his daily routine.  Therapist assisted with scheduling the client for next appointments. Therapist addressed questions and concerns.    Plan: Return again in 3 weeks for individual therapy.  Diagnosis: Generalized anxiety disorder w/ panic attacks    Neena Rhymes Abbygale Lapid, LCSW 01/08/2020

## 2020-01-29 ENCOUNTER — Other Ambulatory Visit: Payer: Self-pay

## 2020-01-29 ENCOUNTER — Ambulatory Visit (INDEPENDENT_AMBULATORY_CARE_PROVIDER_SITE_OTHER): Payer: No Payment, Other | Admitting: Clinical

## 2020-01-29 DIAGNOSIS — F411 Generalized anxiety disorder: Secondary | ICD-10-CM

## 2020-01-29 DIAGNOSIS — F410 Panic disorder [episodic paroxysmal anxiety] without agoraphobia: Secondary | ICD-10-CM | POA: Diagnosis not present

## 2020-01-30 NOTE — Progress Notes (Signed)
   THERAPIST PROGRESS NOTE  Session Time: 50 minutes  Participation Level: Active  Behavioral Response: CasualAlertEuthymic  Type of Therapy: Individual Therapy  Treatment Goals addressed: Anxiety  Interventions: CBT  Summary:  Jesse Brooks is a 34 y.o. male who presents for the scheduled session oriented times five, appropriately dressed, and friendly. Client denies hallucinations and delusions. Client reported on today he is feeling tired but doing well. 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 Client reported a recent positive change in his home life; the mother of his toddler son allowed him to keep him. Client reported spending time with his son caused his anxiety to decrease. Client reported he continues to be an active part of his oldest sons life also being involved in his recreational activties and getting him from school. Client discussed with the therapist improvement with his symptoms as he has not had a panic attack since the last session. Client reported he has felt tingling in his fingers but nothing more than that. Client stated, "I try to process my thoughts better than I use to avoid over thinking". Client reported he has continued to work on what was discussed in the previous session of being mindful of managing his thoughts in the morning and practicing deep breathing and/ or other preferred relaxation techniques. Client engaged with the therapist in discussion about thinking errors and continuing to use deep breathing exercises.      Suicidal/Homicidal: Nowithout intent/plan  Therapist Response:  Therapist began the session by checking in and asking the client how he has been doing since the last session. Therapist actively listened to the client thoughts and concerns. Therapist asked the client about his current progress with using taught coping skills to manage his anxiety  symptoms. Therapist continued to discuss cognitive distortions that may be viewed as normal but prevent the client from progressing with improving his ability to problem solve. Therapist instructed to the client to continue using deep breathing exercises, practicing self care, and challenging negative self talk that provokes anxiety. Therapist assisted with scheduling for next sessions.    Plan: Return again in 4 weeks for individual therapy.  Diagnosis: Generalized anxiety disorder with panic attacks   Neena Rhymes Sesar Madewell, LCSW 01/30/2020

## 2020-02-19 ENCOUNTER — Other Ambulatory Visit: Payer: Self-pay

## 2020-02-19 ENCOUNTER — Ambulatory Visit (INDEPENDENT_AMBULATORY_CARE_PROVIDER_SITE_OTHER): Payer: No Payment, Other | Admitting: Clinical

## 2020-02-19 DIAGNOSIS — F411 Generalized anxiety disorder: Secondary | ICD-10-CM | POA: Diagnosis not present

## 2020-02-19 NOTE — Progress Notes (Signed)
   THERAPIST PROGRESS NOTE  Session Time: 45 minutes  Participation Level: Active  Behavioral Response: CasualAlertAnxious  Type of Therapy: Individual Therapy  Treatment Goals addressed: Anxiety  Interventions: CBT  Summary:  Jesse Brooks is a 34 y.o. male who presents for the scheduled session oriented times five, appropriately dressed, and cooperative. Client denied hallucinations and delusions. Client reported on today he he woke up feeling a little anxious. Client reported he has had some anxiety coming up but labeled it as "mild, I can handle it". Client reported he has upcoming work obligations that will keep him out of town frequently and is worried about balancing work and family life. Client discussed he has been worried about what the mother of his youngest son would say and how she would react. Client reported he does not want her to change his ability to see his son as he has been doing. Client reported he does not like the fact will by default decrease the amount of time with his children but he is excited about what his upcoming events could do for his career. Client discussed with the therapist his past and current relationships and how they have affected his mood. Client stated, "I'm understanding things a lot differently now". Client engaged with the therapist in discussion about reinforcing boundaries for emotional and physical well being.       Suicidal/Homicidal: Nowithout intent/plan  Therapist Response:  Therapist began the session by checking and asking the client how he has been since the last session. Therapist actively listened to the clients thoughts and feelings. Therapist engaged the client in discussion by using open ended questions to identify source of anxious mood and symptoms. Therapist used CBT to discuss persona boundaries and promote critical thinking about reason for those limits. Therapist assigned the client homework to continue using effective  coping skills.     Plan: Return again in 2 weeks for individual therapy.  Diagnosis: Generalized anxiety disorder w/ panic attacks  Neena Rhymes Baneen Wieseler, LCSW 02/19/2020

## 2020-03-03 ENCOUNTER — Ambulatory Visit (HOSPITAL_COMMUNITY): Payer: Self-pay | Admitting: Clinical

## 2020-03-04 ENCOUNTER — Ambulatory Visit (HOSPITAL_COMMUNITY): Payer: No Payment, Other | Admitting: Clinical

## 2020-03-10 ENCOUNTER — Other Ambulatory Visit: Payer: Self-pay

## 2020-03-10 ENCOUNTER — Ambulatory Visit (INDEPENDENT_AMBULATORY_CARE_PROVIDER_SITE_OTHER): Payer: No Payment, Other | Admitting: Clinical

## 2020-03-10 DIAGNOSIS — F411 Generalized anxiety disorder: Secondary | ICD-10-CM

## 2020-03-10 DIAGNOSIS — F410 Panic disorder [episodic paroxysmal anxiety] without agoraphobia: Secondary | ICD-10-CM | POA: Diagnosis not present

## 2020-03-11 NOTE — Progress Notes (Signed)
° °  THERAPIST PROGRESS NOTE  Session Time: 53 minutes  Participation Level: Active  Behavioral Response: CasualAlertEuthymic  Type of Therapy: Individual Therapy  Treatment Goals addressed: Anxiety  Interventions: CBT  Summary:  Jesse Brooks is a 33 y.o. male who presents oriented times five, appropriately dressed, and friendly. Client denied hallucinations and delusions. Client reported on today he is feeling good. Client reported he missed his last session because his mother was sick and he had to get his son earlier than expected. Client reported he has had a little bit of anxiety but nothing that is unmanageable. 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  Client discussed with the therapist often times feeling rushed during the day to get things done and then spend majority of his afternoons with his sons extra curricular activities. Client engaged with the therapist in discussion about how it affects his anxiety symptoms. Client reported he feels rushed during the day. Client reported his daily routine varies on a daily basis depending on when he wakes up, for example he woke up at 12p today. Client reported his father tells him to re -engage in physical activity at the gym or by his own routine. Client reported on days when he feels his anxiety rising his thought "process is don't get worse" opposed to working on coping techniques.  Client engaged with the therapist in discussion about challenging negative thought patterns and creating a morning routine that gives him time to implement activities for himself such as exercise.       Suicidal/Homicidal: Nowithout intent/plan  Therapist Response:  Therapist began the session by checking in and asking the client how he has been since the last session. Therapist actively  listened to the clients thoughts and feelings. Therapist engaged the client in discussion asking open ended questions about challenges in his daily activity that trigger his anxiety symptoms. Therapist used CBT to discuss challenging negative and/ or premeditated anticipatory thoughts. Therapist instructed the client to create a morning routine that includes physical exercise to help ease anxiety symptoms of feeling "rushed" during the day. Therapist assisted with scheduling next appointments.   Plan: Return again in 3 weeks for individual therapy.  Diagnosis: Generalized anxiety disorder w/ panic attacks  Jesse Rhymes Austin Pongratz, LCSW 03/11/2020

## 2020-03-24 ENCOUNTER — Other Ambulatory Visit: Payer: Self-pay

## 2020-03-24 ENCOUNTER — Ambulatory Visit (INDEPENDENT_AMBULATORY_CARE_PROVIDER_SITE_OTHER): Payer: No Payment, Other | Admitting: Clinical

## 2020-03-24 DIAGNOSIS — F410 Panic disorder [episodic paroxysmal anxiety] without agoraphobia: Secondary | ICD-10-CM | POA: Diagnosis not present

## 2020-03-24 DIAGNOSIS — F411 Generalized anxiety disorder: Secondary | ICD-10-CM

## 2020-03-26 NOTE — Progress Notes (Signed)
   THERAPIST PROGRESS NOTE  Session Time: 45 minutes  Participation Level: Active  Behavioral Response: CasualAlertAnxious  Type of Therapy: Individual Therapy  Treatment Goals addressed: Anxiety  Interventions: CBT  Summary:  Jesse Brooks is a 34 y.o. male who presents for the scheduled session oriented times five, appropriately dressed, and friendly. Client denied hallucinations and delusions. Client reported on today feeling well. Client reported since the last session he has been staying busy with work. 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 Client reported his anxiety has been situational. Client reported having minor episodes of tingling in his fingers but he has been able to manage it well on his own. Client reported overall he feels that he is able to better to manage his anxiety by working on the way he thinks and being able to spend time with his kids has helped. Client reported he is anxious about being able t figure out his schedule so he can plan to have time with his children while back and forth in town in the near future. Client reported he is now using time to catch up with his thoughts. Client reported his sleep has been good but erratic due to his work schedule. Client reported he may go to sleep early in the morning then wake up at 12pm in the afternoon then he has to get ready to do running around for his children. As previously reported the client feels like he does not have time to do things for him with that schedule.      Suicidal/Homicidal: Nowithout intent/plan  Therapist Response:  Therapist began the session by checking in and asking the client how he has been doing since last seen. Therapist allowed time to focus on the clients thoughts and feelings while actively listening. Therapist used CBT to engage the client about identifying how he has made changes to how he processes  anxiety provoking situations. Therapist reviewed the previous sessions homework with the client to assess implementation and effectiveness. Therapist assigned the client to complete the previous session homework of progressively creating a routine that includes exercise and waking up at a decent hour to implement self care activities. Therapist assisted with scheduling next appointments.    Plan: Return again in 5 weeks for individual therapy.  Diagnosis: Generalized anxiety disorder with panic attacks   Neena Rhymes [name], LCSW 03/26/2020

## 2020-05-04 ENCOUNTER — Ambulatory Visit: Payer: Self-pay

## 2020-05-06 ENCOUNTER — Ambulatory Visit: Payer: Self-pay | Admitting: Family Medicine

## 2020-05-06 ENCOUNTER — Encounter: Payer: Self-pay | Admitting: Family Medicine

## 2020-05-06 ENCOUNTER — Other Ambulatory Visit: Payer: Self-pay

## 2020-05-06 DIAGNOSIS — Z113 Encounter for screening for infections with a predominantly sexual mode of transmission: Secondary | ICD-10-CM

## 2020-05-06 DIAGNOSIS — N341 Nonspecific urethritis: Secondary | ICD-10-CM

## 2020-05-06 LAB — GRAM STAIN

## 2020-05-06 MED ORDER — DOXYCYCLINE HYCLATE 100 MG PO TABS: 100.0000 mg | ORAL_TABLET | Freq: Two times a day (BID) | ORAL | 0 refills | Status: AC

## 2020-05-06 NOTE — Progress Notes (Signed)
Gram stain reviewed and pt treated for NGU per standing order and per provider order. Provider orders completed.

## 2020-05-06 NOTE — Progress Notes (Signed)
[facility] STI clinic/screening visit  Subjective:  Jesse Brooks is a 34 y.o. male being seen today for No chief complaint on file.    The patient reports they do have symptoms.   Patient has the following medical conditions:   Patient Active Problem List   Diagnosis Date Noted  . Generalized anxiety disorder with panic attacks 12/21/2019    HPI  Pt reports he had white discharge 1 time last week, none since. Also endorses tenderness at tip of penis and on/off lower abd pain for past month.   Pt last urinated 3 hrs ago.  See flowsheet for further details and programmatic requirements.   Last HIV test per pt/review of record: a rfew years ago per pt Last tetanus vaccine: pt unsure, declines today Flu vaccine: no   No components found for: HCV  The following portions of the patient's history were reviewed and updated as appropriate: allergies, current medications, past medical history, past social history, past surgical history and problem list.  Objective:  There were no vitals filed for this visit.   Physical Exam Constitutional:      Appearance: Normal appearance.  HENT:     Head: Normocephalic and atraumatic.     Comments: No nits or hair loss    Mouth/Throat:     Mouth: Mucous membranes are moist.     Pharynx: Oropharynx is clear. No oropharyngeal exudate or posterior oropharyngeal erythema.  Pulmonary:     Effort: Pulmonary effort is normal.  Abdominal:     General: Abdomen is flat.     Palpations: Abdomen is soft. There is no hepatomegaly or mass.     Tenderness: There is no abdominal tenderness.  Genitourinary:    Pubic Area: No rash or pubic lice.      Penis: Circumcised. Tenderness (mild tenderness tip of penis) and discharge (very minimal discharge present) present.      Testes: Normal.     Epididymis:     Right: Normal.     Left: Normal.     Rectum: Normal.  Lymphadenopathy:     Head:     Right side of head: No preauricular  or posterior auricular adenopathy.     Left side of head: No preauricular or posterior auricular adenopathy.     Cervical: No cervical adenopathy.     Upper Body:     Right upper body: No supraclavicular or axillary adenopathy.     Left upper body: No supraclavicular or axillary adenopathy.     Lower Body: No right inguinal adenopathy. No left inguinal adenopathy.  Skin:    General: Skin is warm and dry.     Findings: No rash.  Neurological:     Mental Status: He is alert and oriented to person, place, and time.      Assessment and Plan:  Jesse Brooks is a 34 y.o. male presenting to the [facility] Department for STI screening    1. Screening examination for venereal disease -Pt with symptoms. Screenings today as below. Treat gram stain per standing order. -Patient does not meet criteria for HepB, HepC Screening.  -Counseled on warning s/sx and when to seek care. Recommended condom use with all sex and discussed importance of condom use for STI prevention. - HIV [facility] - Syphilis Serology, [facility] - Gram stain - Gonococcus culture  2. NGU (nongonococcal urethritis) -Treat for NGU today. Advised to RTC or see a medical provider if symptoms worsen or fail to improve.  -  Pt counseled regarding medication. No known allergies to this medication. -Advised no sex for 7 days after both pt and partner completes treatment and encouraged condoms with all sex. - doxycycline (VIBRA-TABS) 100 MG tablet; Take 1 tablet (100 mg total) by mouth 2 (two) times daily for 7 days.  Dispense: 14 tablet; Refill: 0   Return if symptoms worsen or fail to improve, for ~3 months for TOC, and PRN.  Future Appointments  Date Time Provider Department Center  05/13/2020 10:00 AM [place], Neena Rhymes, LCSW [facility] None    Ann Held, PA-C

## 2020-05-10 LAB — GONOCOCCUS CULTURE

## 2020-05-13 ENCOUNTER — Encounter (HOSPITAL_COMMUNITY): Payer: Self-pay

## 2020-05-13 ENCOUNTER — Other Ambulatory Visit: Payer: Self-pay

## 2020-05-13 ENCOUNTER — Ambulatory Visit (HOSPITAL_COMMUNITY): Payer: No Payment, Other | Admitting: Clinical
